# Patient Record
Sex: Female | Born: 1995 | Race: Black or African American | Hispanic: No | Marital: Single | Smoking: Never smoker
Health system: Southern US, Community
[De-identification: ages and names within clinical notes are randomized; demographics above are authoritative.]

---
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
## 2016-06-26 ENCOUNTER — Encounter (HOSPITAL_BASED_OUTPATIENT_CLINIC_OR_DEPARTMENT_OTHER): Payer: Self-pay | Admitting: *Deleted

## 2016-06-26 ENCOUNTER — Emergency Department (HOSPITAL_BASED_OUTPATIENT_CLINIC_OR_DEPARTMENT_OTHER): Payer: Self-pay

## 2016-06-26 ENCOUNTER — Emergency Department (HOSPITAL_BASED_OUTPATIENT_CLINIC_OR_DEPARTMENT_OTHER)
Admission: EM | Admit: 2016-06-26 | Discharge: 2016-06-26 | Disposition: A | Discharge status: Home / Self Care | Payer: Self-pay | Attending: Emergency Medicine | Admitting: Emergency Medicine

## 2016-06-26 DIAGNOSIS — M25572 Pain in left ankle and joints of left foot: Secondary | ICD-10-CM

## 2016-06-26 DIAGNOSIS — Y999 Unspecified external cause status: Secondary | ICD-10-CM | POA: Insufficient documentation

## 2016-06-26 DIAGNOSIS — Y929 Unspecified place or not applicable: Secondary | ICD-10-CM | POA: Insufficient documentation

## 2016-06-26 DIAGNOSIS — X501XXA Overexertion from prolonged static or awkward postures, initial encounter: Secondary | ICD-10-CM | POA: Insufficient documentation

## 2016-06-26 DIAGNOSIS — S93402A Sprain of unspecified ligament of left ankle, initial encounter: Secondary | ICD-10-CM | POA: Insufficient documentation

## 2016-06-26 DIAGNOSIS — Y9368 Activity, volleyball (beach) (court): Secondary | ICD-10-CM | POA: Insufficient documentation

## 2016-06-26 NOTE — ED Triage Notes (Signed)
Left ankle injury tonight. She twisted it during a volleyball game.

## 2016-06-26 NOTE — Discharge Instructions (Signed)
Elevate your ankle and use ice as often as you can, 20 minutes on and 20 minutes off, be sure to keep a thin cloth between your skin and the ice. Follow up at the [facility] or Dr. Roda ShuttersXu in 3 days if your symptoms are not improving. Take OTC ibuprofen as needed for pain.   Return to the emergency department if you experience increased swelling, pain, warmth, red streaks, fever, numbness or any other concerning symptoms.

## 2016-06-26 NOTE — ED Provider Notes (Signed)
[facility] Provider Note   CSN: [id number] Arrival date & time: 06/26/16  2010  By signing my name below, I, Nelwyn Salisbury, attest that this documentation has been prepared under the direction and in the presence of non-physician practitioner, Mattie Marlin, PA-C. Electronically Signed: Nelwyn Salisbury, Scribe. 06/26/2016. 8:33 PM.  History   Chief Complaint Chief Complaint  Patient presents with  . Ankle Injury   The history is provided by the patient and a parent. No language interpreter was used.     HPI Comments:  Leslie Gaines is a 20 y.o. female who presents to the Emergency Department complaining of sudden-onset constant unchanged left ankle pain onset earlier today When she twisted her ankle during a volleyball game. Pt describes the pain as 7/10 and notes that it radiates up her posterior calf, exacerbated with movement, walking and palpation. No alleviating factors indicated. She reports associated swelling to the area. Pt denies any numbness or tingling to the area.  History reviewed. No pertinent past medical history.  There are no active problems to display for this patient.   History reviewed. No pertinent surgical history.  OB History    No data available       Home Medications    Prior to Admission medications   Not on File    Family History No family history on file.  Social History Social History  Substance Use Topics  . Smoking status: Never Smoker  . Smokeless tobacco: Never Used  . Alcohol use Not on file     Allergies   Review of patient's allergies indicates no known allergies.   Review of Systems Review of Systems  Musculoskeletal: Positive for arthralgias, joint swelling and myalgias.  Neurological: Negative for numbness.     Physical Exam Updated Vital Signs BP 125/80   Pulse 92   Temp 98.2 F (36.8 C) (Oral)   Resp 20   Ht 5\' 8"  (1.727 m)   Wt 160 lb (72.6 kg)   SpO2 99%   BMI 24.33 kg/m   Physical Exam   Constitutional: She appears well-developed and well-nourished. No distress.  HENT:  Head: Normocephalic and atraumatic.  Eyes: Conjunctivae are normal.  Cardiovascular:  Pulses:      Dorsalis pedis pulses are 2+ on the right side, and 2+ on the left side.  Neurovascularly intact distally.  Pulmonary/Chest: Effort normal. No respiratory distress.  Musculoskeletal: Normal range of motion.  Examination of left ankle revealed no deformities,. Moderate Edema and mild erythema noted to lateral ankle just inferior to the lateral malleoli area Tenderness to palpation of lateral and medial malleoli. No ecchymosis noted. Limited ROM due to pain. Full ROM of knee. No tenderness to palpation of proximal tibfib, 2+ DP pulses, sensation intact. Patient is neurovascular intact distally.  Neurological: She is alert. Coordination normal.  Skin: Skin is warm and dry. She is not diaphoretic.  Psychiatric: She has a normal mood and affect. Her behavior is normal.  Nursing note and vitals reviewed.    ED Treatments / Results  DIAGNOSTIC STUDIES:  Oxygen Saturation is 99% on RA, normal by my interpretation.    COORDINATION OF CARE:  10:12 PM Discussed treatment plan with pt at bedside which included NSAIDs and follow-up with orthopedics and pt agreed to plan.  Labs (all labs ordered are listed, but only abnormal results are displayed) Labs Reviewed - No data to display  EKG  EKG Interpretation None       Radiology Dg Ankle Complete Left  Result Date: 06/26/2016 CLINICAL DATA:  Fall with twisting injury to the left ankle today. Pain and swelling laterally. EXAM: LEFT ANKLE COMPLETE - 3+ VIEW COMPARISON:  None. FINDINGS: Mild lateral soft tissue swelling about the left ankle. No evidence of acute fracture or dislocation. No focal bone lesion or bone destruction. Bone cortex appears intact. IMPRESSION: Lateral soft tissue swelling.  No acute fractures identified. Electronically Signed   By: Burman NievesWilliam   Stevens M.D.   On: 06/26/2016 20:53    Procedures Procedures (including critical care time)  Medications Ordered in ED Medications - No data to display   Initial Impression / Assessment and Plan / ED Course  I have reviewed the triage vital signs and the nursing notes.  Pertinent labs & imaging results that were available during my care of the patient were reviewed by me and considered in my medical decision making (see chart for details).  Clinical Course    Patient with Left ankle pain. Presentation concerning for sprain. X-rays reviewed by me revealed no bony abnormality, dislocation, or fracture. Patient is neurovascularly intact distally and able to move ankle although states it is painful. Patient given ankle brace and crutches in the ED. Discussed RICE and pain medication. Instructed patient to follow up with their primary care provider or orthopedics within 2-3 days to have ankle reevaluated. Discussed strict return precautions to the ED. patient appears reliable and expressed understanding to the discharge instructions.   I personally performed the services described in this documentation, which was scribed in my presence. The recorded information has been reviewed and is accurate.     Final Clinical Impressions(s) / ED Diagnoses   Final diagnoses:  Left ankle pain  Left ankle sprain, initial encounter    New Prescriptions There are no discharge medications for this patient.     Jerre SimonJessica L Shawnelle Spoerl, PA 06/27/16 0050    Lyndal Pulleyaniel Knott, MD 06/27/16 [id number]

## 2017-10-19 IMAGING — CR DG ANKLE COMPLETE 3+V*L*
3 series · 3 of 3 positions shown · non-contrast
Comparison: None.

CLINICAL DATA: Fall with twisting injury to the left ankle today.
Pain and swelling laterally.

EXAM:
LEFT ANKLE COMPLETE - 3+ VIEW

[t ankle joint ap left]
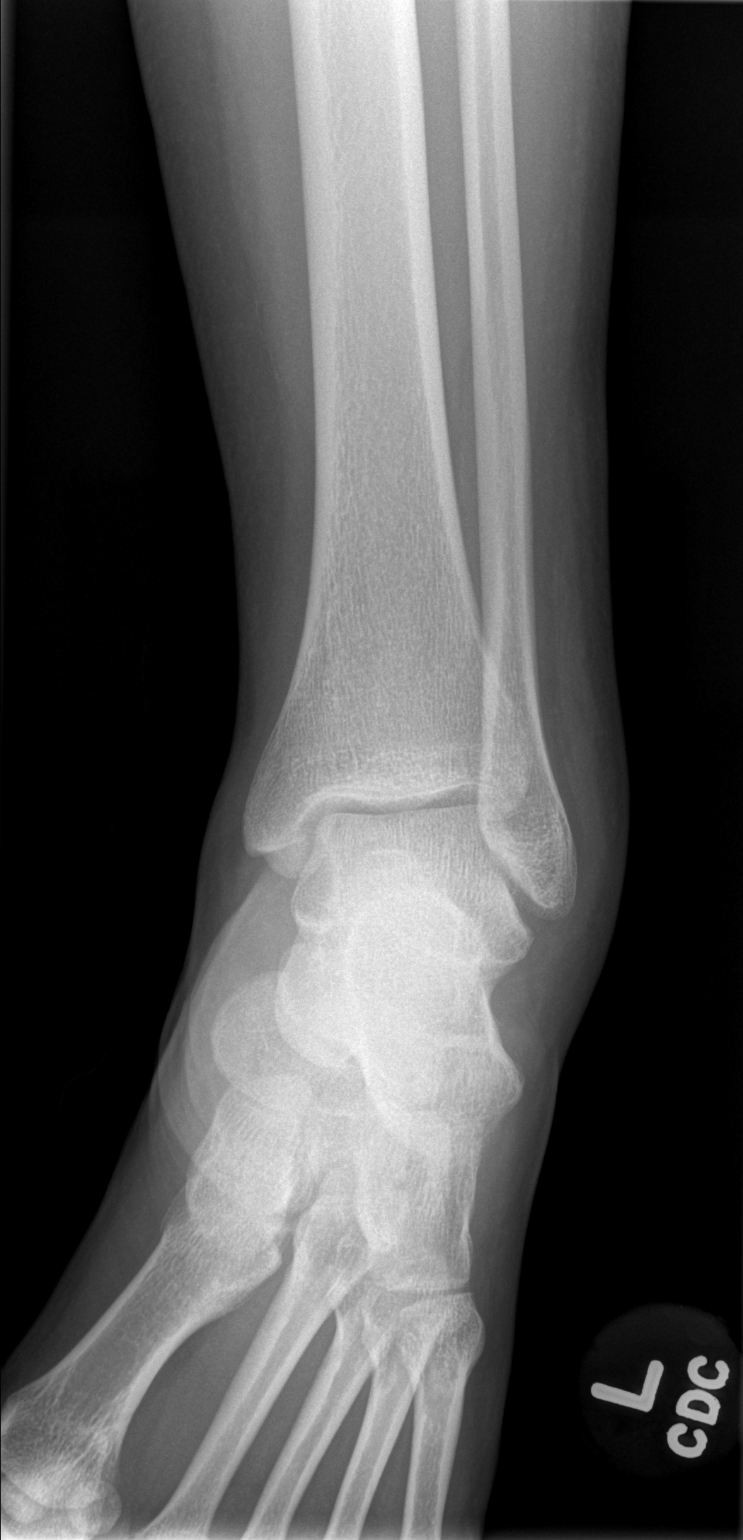

[t ankle joint oblique left]
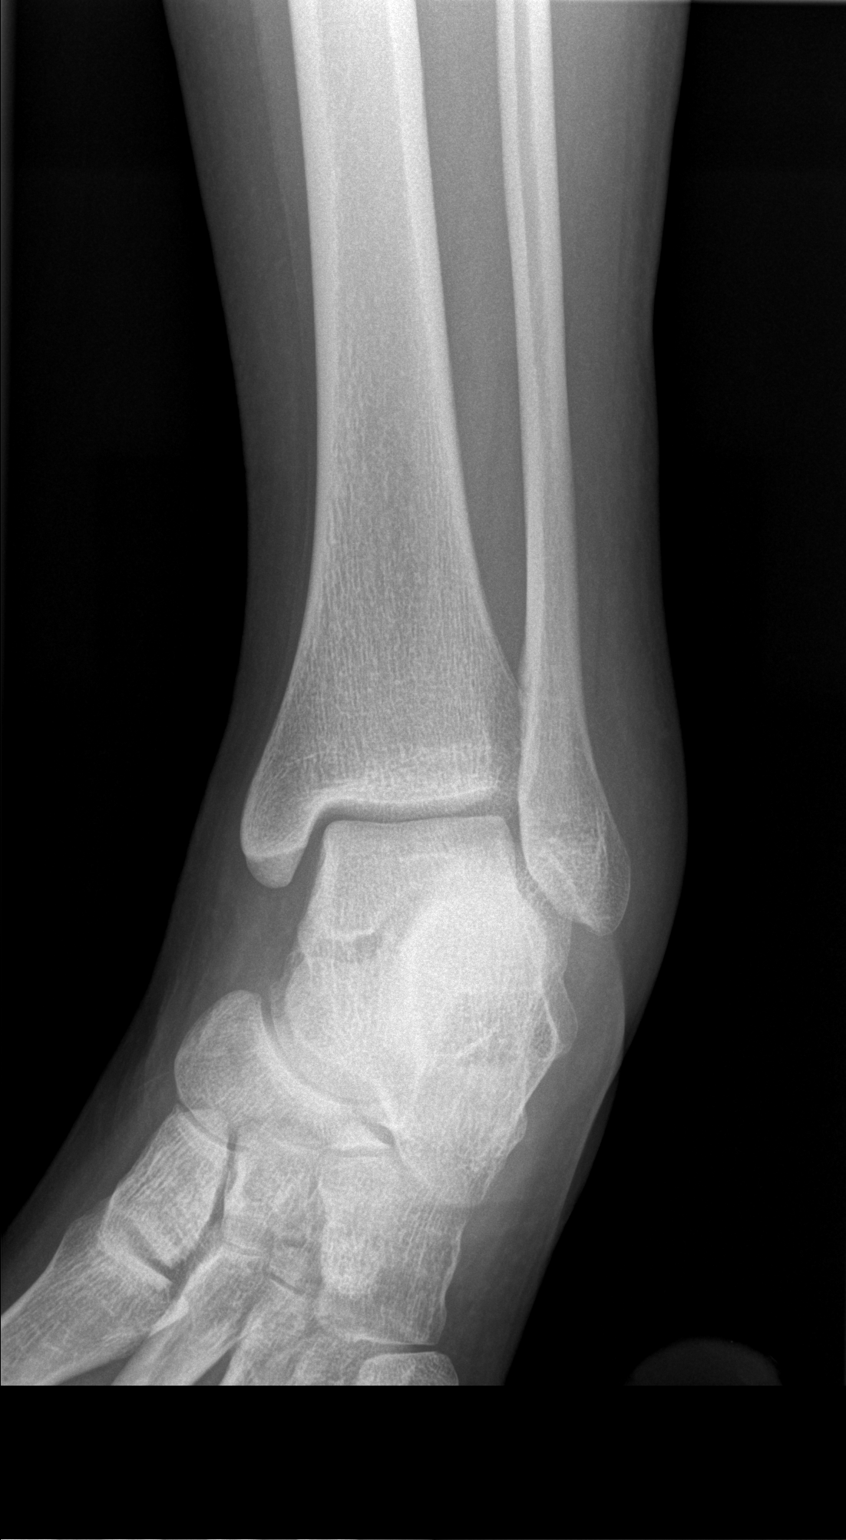

[t ankle joint lat left]
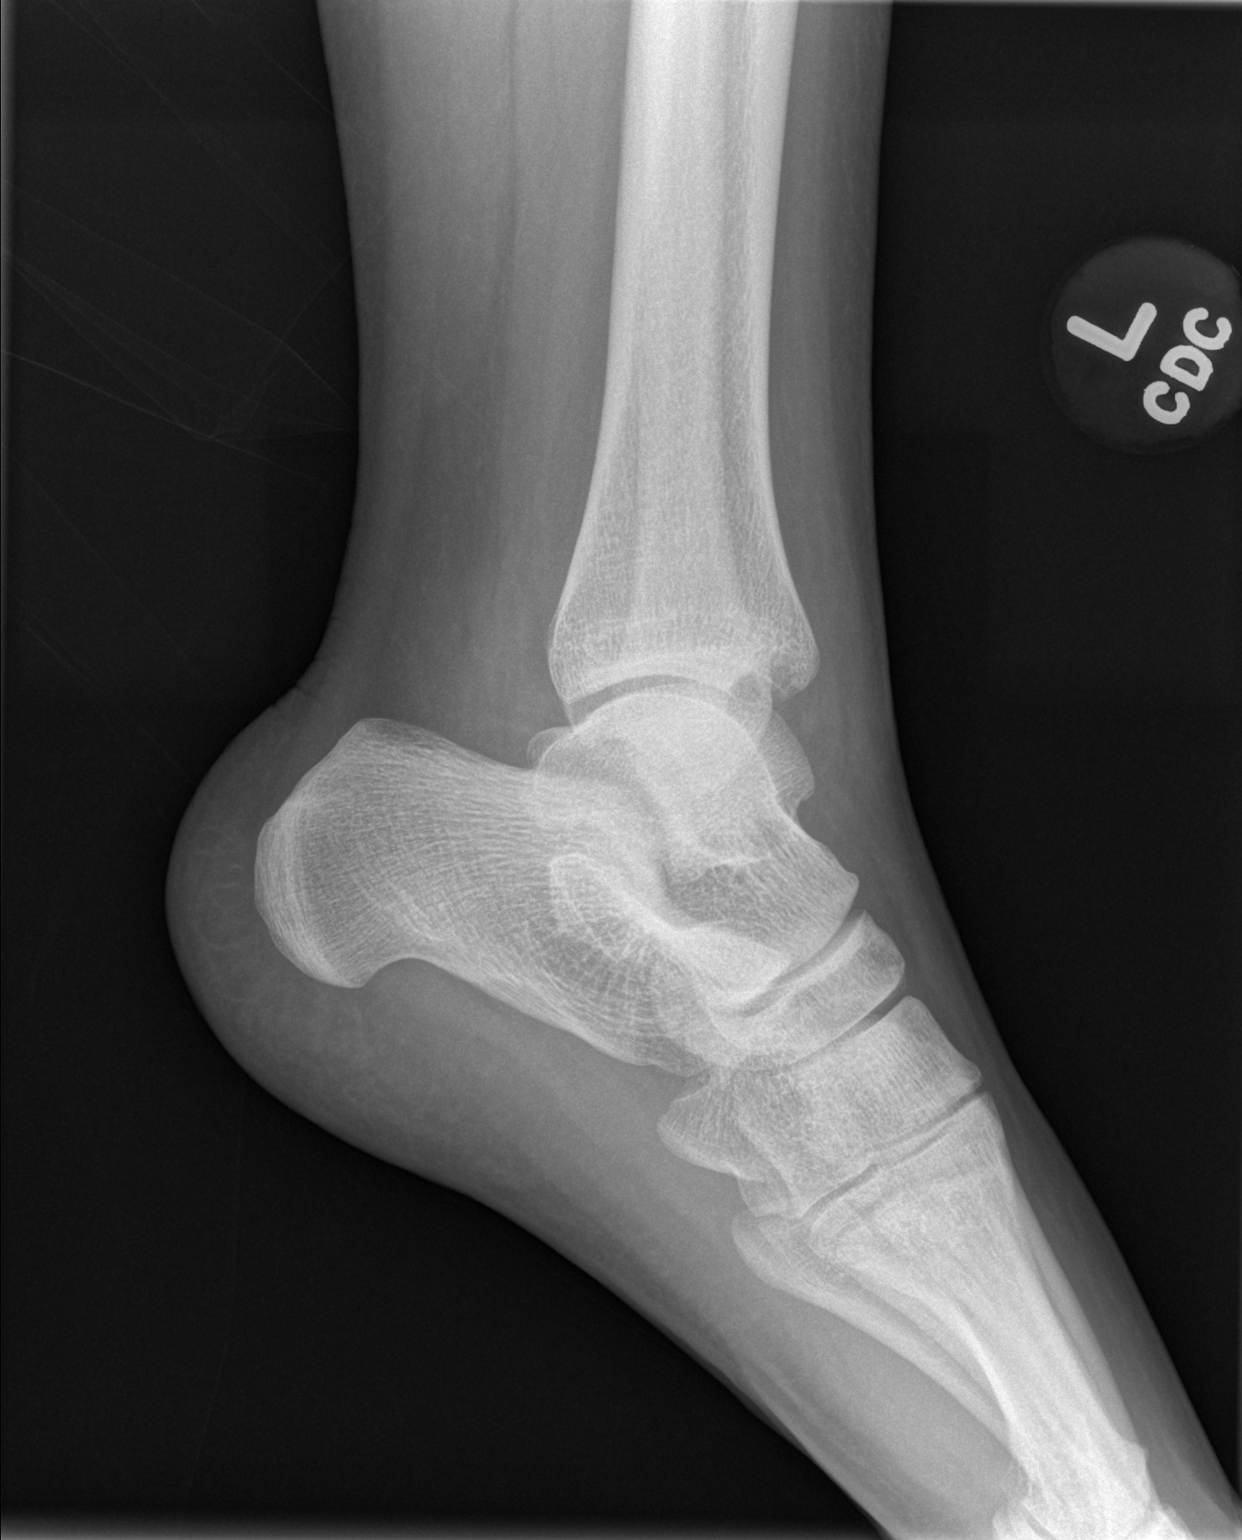

[3 of 3 positions shown; findings below may reference images not displayed]

FINDINGS: Mild lateral soft tissue swelling about the left ankle. No evidence
of acute fracture or dislocation. No focal bone lesion or bone
destruction. Bone cortex appears intact.
IMPRESSION: Lateral soft tissue swelling.  No acute fractures identified.
